# Patient Record
Sex: Male | Born: 1990 | Race: Black or African American | Hispanic: No | Marital: Single | Smoking: Current every day smoker
Health system: Southern US, Community
[De-identification: ages and names within clinical notes are randomized; demographics above are authoritative.]

## PROBLEM LIST (undated history)

## (undated) DIAGNOSIS — F909 Attention-deficit hyperactivity disorder, unspecified type: Secondary | ICD-10-CM

## (undated) DIAGNOSIS — J45909 Unspecified asthma, uncomplicated: Secondary | ICD-10-CM

## (undated) HISTORY — PX: NO PAST SURGERIES: SHX2092

---
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
## 2013-08-11 ENCOUNTER — Encounter (HOSPITAL_COMMUNITY): Payer: Self-pay | Admitting: *Deleted

## 2013-08-11 ENCOUNTER — Emergency Department (INDEPENDENT_AMBULATORY_CARE_PROVIDER_SITE_OTHER)
Admission: EM | Admit: 2013-08-11 | Discharge: 2013-08-11 | Disposition: A | Discharge status: Home / Self Care | Payer: Medicaid Other | Source: Home / Self Care | Attending: Emergency Medicine | Admitting: Emergency Medicine

## 2013-08-11 DIAGNOSIS — J020 Streptococcal pharyngitis: Secondary | ICD-10-CM

## 2013-08-11 MED ORDER — METHYLPREDNISOLONE 4 MG PO KIT: PACK | ORAL | Status: DC

## 2013-08-11 MED ORDER — PENICILLIN V POTASSIUM 500 MG PO TABS: 500.0000 mg | ORAL_TABLET | Freq: Three times a day (TID) | ORAL | Status: DC

## 2013-08-11 NOTE — ED Provider Notes (Signed)
CSN: [id number]     Arrival date & time 08/11/13  1050 History     First MD Initiated Contact with Patient 08/11/13 1210     Chief Complaint  Patient presents with  . Sore Throat   (Consider location/radiation/quality/duration/timing/severity/associated sxs/prior Treatment) HPI Comments: 22 year old male presents for evaluation of sore throat, getting progressively worse for the past week. The pain is exacerbated by swallowing or breathing into his toes. Apart from the sore throat, he denies any symptoms. He denies fever, chills, NVD, rash. No sick contacts or recent travel. He has saltwater and taken over-the-counter analgesics with mild to moderate relief in his symptoms.  Patient is a 22 y.o. male presenting with pharyngitis.  Sore Throat Pertinent negatives include no chest pain, no abdominal pain and no shortness of breath.    History reviewed. No pertinent past medical history. History reviewed. No pertinent past surgical history. No family history on file. History  Substance Use Topics  . Smoking status: Never Smoker   . Smokeless tobacco: Not on file  . Alcohol Use: No    Review of Systems  Constitutional: Negative for fever, chills and fatigue.  HENT: Positive for sore throat. Negative for neck pain and neck stiffness.   Eyes: Negative for visual disturbance.  Respiratory: Negative for cough and shortness of breath.   Cardiovascular: Negative for chest pain, palpitations and leg swelling.  Gastrointestinal: Negative for nausea, vomiting, abdominal pain, diarrhea and constipation.  Genitourinary: Negative for dysuria, urgency, frequency and hematuria.  Musculoskeletal: Negative for myalgias and arthralgias.  Skin: Negative for rash.  Neurological: Negative for dizziness, weakness and light-headedness.    Allergies  Review of patient's allergies indicates no known allergies.  Home Medications   Current Outpatient Rx  Name  Route  Sig  Dispense  Refill  .  methylPREDNISolone (MEDROL DOSEPAK) 4 MG tablet      Use as directed   21 tablet   0   . penicillin v potassium (VEETID) 500 MG tablet   Oral   Take 1 tablet (500 mg total) by mouth 3 (three) times daily.   21 tablet   0    BP 125/88  Pulse 60  Temp(Src) 98.5 F (36.9 C) (Oral)  Resp 20  SpO2 100% Physical Exam  Nursing note and vitals reviewed. Constitutional: He is oriented to person, place, and time. He appears well-developed and well-nourished. No distress.  HENT:  Head: Normocephalic and atraumatic.  Mouth/Throat: Oropharyngeal exudate (with erythema) present.  Eyes: Conjunctivae are normal. Pupils are equal, round, and reactive to light.  Pulmonary/Chest: Effort normal. No respiratory distress.  Neurological: He is alert and oriented to person, place, and time. Coordination normal.  Skin: Skin is warm and dry. No rash noted. He is not diaphoretic.  Psychiatric: He has a normal mood and affect. Judgment normal.    ED Course   Procedures (including critical care time)  Labs Reviewed  POCT RAPID STREP A ([facility] CARE ONLY) - Abnormal; Notable for the following:    Streptococcus, Group A Screen (Direct) POSITIVE (*)    All other components within normal limits   No results found. 1. Streptococcal sore throat     MDM  Treat with penicillin, Medrol Dosepak. Tylenol for ibuprofen as needed for pain. Followup if worsening.   Meds ordered this encounter  Medications  . penicillin v potassium (VEETID) 500 MG tablet    Sig: Take 1 tablet (500 mg total) by mouth 3 (three) times daily.    Dispense:  21 tablet    Refill:  0  . methylPREDNISolone (MEDROL DOSEPAK) 4 MG tablet    Sig: Use as directed    Dispense:  21 tablet    Refill:  0     Graylon Good, PA-C 08/11/13 1225

## 2013-08-11 NOTE — ED Provider Notes (Signed)
Medical screening examination/treatment/procedure(s) were performed by non-physician practitioner and as supervising physician I was immediately available for consultation/collaboration.  Anasophia Pecor, M.D.  Dickie Labarre C Tonetta Napoles, MD 08/11/13 1436 

## 2013-08-11 NOTE — ED Notes (Signed)
Patient complains of sore throat; burning sensation states hard to breath and swallow.

## 2019-10-25 ENCOUNTER — Emergency Department (HOSPITAL_COMMUNITY)
Admission: EM | Admit: 2019-10-25 | Discharge: 2019-10-26 | Disposition: A | Discharge status: Home / Self Care | Payer: Self-pay | Attending: Emergency Medicine | Admitting: Emergency Medicine

## 2019-10-25 ENCOUNTER — Other Ambulatory Visit: Payer: Self-pay

## 2019-10-25 ENCOUNTER — Encounter (HOSPITAL_COMMUNITY): Payer: Self-pay | Admitting: Emergency Medicine

## 2019-10-25 ENCOUNTER — Emergency Department (HOSPITAL_COMMUNITY): Payer: Self-pay

## 2019-10-25 DIAGNOSIS — W228XXA Striking against or struck by other objects, initial encounter: Secondary | ICD-10-CM | POA: Insufficient documentation

## 2019-10-25 DIAGNOSIS — F17210 Nicotine dependence, cigarettes, uncomplicated: Secondary | ICD-10-CM | POA: Insufficient documentation

## 2019-10-25 DIAGNOSIS — Y9389 Activity, other specified: Secondary | ICD-10-CM | POA: Insufficient documentation

## 2019-10-25 DIAGNOSIS — F1210 Cannabis abuse, uncomplicated: Secondary | ICD-10-CM | POA: Insufficient documentation

## 2019-10-25 DIAGNOSIS — Y929 Unspecified place or not applicable: Secondary | ICD-10-CM | POA: Insufficient documentation

## 2019-10-25 DIAGNOSIS — Y998 Other external cause status: Secondary | ICD-10-CM | POA: Insufficient documentation

## 2019-10-25 DIAGNOSIS — S62350A Nondisplaced fracture of shaft of second metacarpal bone, right hand, initial encounter for closed fracture: Secondary | ICD-10-CM | POA: Insufficient documentation
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
## 2019-10-26 MED ORDER — TRAMADOL HCL 50 MG PO TABS: 50.0000 mg | ORAL_TABLET | Freq: Four times a day (QID) | ORAL | 0 refills | Status: DC | PRN

## 2019-10-26 NOTE — Discharge Instructions (Signed)
Take the prescribed medication as directed. Follow-up with Dr. Fredna Dow-- call his office on Monday to schedule appt. Return to the ED for new or worsening symptoms.

## 2019-10-26 NOTE — ED Notes (Signed)
Ortho tech paged for splint.

## 2019-10-26 NOTE — ED Provider Notes (Signed)
[facility] EMERGENCY DEPARTMENT Provider Note   CSN: [id number] Arrival date & time: 10/25/19  2338     History   Chief Complaint Chief Complaint  Patient presents with  . Hand Injury    Right    HPI Chris Fitzgerald is a 28 y.o. male.     The history is provided by the patient and medical records.  Hand Injury    28 year old male presenting to the ED with right hand injury.  States he punched a door in anger yesterday.  He has had ongoing swelling and pain, mostly along his first metacarpal of the hand but today has started to spread.  He denies any numbness or weakness of the right hand.  Has normal sensation throughout.  He is right-hand dominant.  He has tried applying ice at home without relief.  History reviewed. No pertinent past medical history.  There are no active problems to display for this patient.   History reviewed. No pertinent surgical history.      Home Medications    Prior to Admission medications   Medication Sig Start Date End Date Taking? Authorizing Provider  methylPREDNISolone (MEDROL DOSEPAK) 4 MG tablet Use as directed 08/11/13   Allena Katz H, PA-C  penicillin v potassium (VEETID) 500 MG tablet Take 1 tablet (500 mg total) by mouth 3 (three) times daily. 08/11/13   Liam Graham, PA-C    Family History No family history on file.  Social History Social History   Tobacco Use  . Smoking status: Current Every Day Smoker    Types: Cigarettes  Substance Use Topics  . Alcohol use: No  . Drug use: Yes    Types: Marijuana     Allergies   Patient has no known allergies.   Review of Systems Review of Systems  Musculoskeletal: Positive for arthralgias.  All other systems reviewed and are negative.    Physical Exam Updated Vital Signs BP 130/74 (BP Location: Left Arm)   Pulse 71   Temp 98.2 F (36.8 C) (Oral)   Resp 18   SpO2 98%   Physical Exam Vitals signs and nursing note reviewed.   Constitutional:      Appearance: He is well-developed.  HENT:     Head: Normocephalic and atraumatic.  Eyes:     Conjunctiva/sclera: Conjunctivae normal.     Pupils: Pupils are equal, round, and reactive to light.  Neck:     Musculoskeletal: Normal range of motion.  Cardiovascular:     Rate and Rhythm: Normal rate and regular rhythm.     Heart sounds: Normal heart sounds.  Pulmonary:     Effort: Pulmonary effort is normal.     Breath sounds: Normal breath sounds.  Abdominal:     General: Bowel sounds are normal.     Palpations: Abdomen is soft.  Musculoskeletal: Normal range of motion.     Comments: Right hand with diffuse swelling along the dorsal aspect, there is no open wound or laceration, tenderness along the first metacarpal without gross deformity, pain with range of motion of the index finger, otherwise normal range of motion, normal distal sensation and cap refill throughout  Skin:    General: Skin is warm and dry.  Neurological:     Mental Status: He is alert and oriented to person, place, and time.      ED Treatments / Results  Labs (all labs ordered are listed, but only abnormal results are displayed) Labs Reviewed - No data to  display  EKG None  Radiology Dg Hand Complete Right  Result Date: 10/26/2019 CLINICAL DATA:  Injury, swelling EXAM: RIGHT HAND - COMPLETE 3+ VIEW COMPARISON:  None. FINDINGS: There is a nondisplaced obliquely oriented fracture seen through the base of the second metacarpal probable intra-articular extension. No other definite fracture seen. Dorsal soft tissue swelling seen. IMPRESSION: Nondisplaced obliquely oriented fracture seen through the base of the second metacarpal. Electronically Signed   By: Jonna Clark M.D.   On: 10/26/2019 00:02    Procedures Procedures (including critical care time)  Medications Ordered in ED Medications - No data to display   Initial Impression / Assessment and Plan / ED Course  I have reviewed the  triage vital signs and the nursing notes.  Pertinent labs & imaging results that were available during my care of the patient were reviewed by me and considered in my medical decision making (see chart for details).  28 year old male presenting to the ED with right hand injury after punching a door yesterday.  Has diffuse swelling of dorsal right hand but no significant deformities on exam.  His hand is neurovascularly intact.  X-ray with nondisplaced, oblique fracture through the second metacarpal.  Volar splint applied, will be given hand surgery follow-up.  Rx tramadol.  May return here for any new/acute changes.  Final Clinical Impressions(s) / ED Diagnoses   Final diagnoses:  Closed nondisplaced fracture of shaft of first metacarpal bone of right hand, initial encounter    ED Discharge Orders         Ordered    traMADol (ULTRAM) 50 MG tablet  Every 6 hours PRN     10/26/19 0226           Garlon Hatchet, PA-C 10/26/19 0304    Zadie Rhine, MD 10/26/19 0725

## 2019-10-26 NOTE — ED Notes (Signed)
Discharge instructions reviewed with pt. Pt verbalized understanding.   

## 2019-10-26 NOTE — Progress Notes (Signed)
Orthopedic Tech Progress Note Patient Details:  Chris Fitzgerald 02-21-1991 [id number]  Ortho Devices Type of Ortho Device: Arm sling, Volar splint Ortho Device/Splint Location: rue Ortho Device/Splint Interventions: Ordered, Application, Adjustment   Post Interventions Patient Tolerated: Well Instructions Provided: Care of device, Adjustment of device   [name] 10/26/2019, 2:34 AM

## 2022-10-27 ENCOUNTER — Encounter (HOSPITAL_BASED_OUTPATIENT_CLINIC_OR_DEPARTMENT_OTHER): Payer: Self-pay

## 2022-10-27 ENCOUNTER — Emergency Department (HOSPITAL_BASED_OUTPATIENT_CLINIC_OR_DEPARTMENT_OTHER): Payer: Medicaid Other | Admitting: Radiology

## 2022-10-27 ENCOUNTER — Emergency Department (HOSPITAL_BASED_OUTPATIENT_CLINIC_OR_DEPARTMENT_OTHER)
Admission: EM | Admit: 2022-10-27 | Discharge: 2022-10-27 | Disposition: A | Discharge status: Home / Self Care | Payer: Self-pay | Attending: Emergency Medicine | Admitting: Emergency Medicine

## 2022-10-27 ENCOUNTER — Other Ambulatory Visit: Payer: Self-pay

## 2022-10-27 DIAGNOSIS — Z23 Encounter for immunization: Secondary | ICD-10-CM | POA: Insufficient documentation

## 2022-10-27 DIAGNOSIS — S61216A Laceration without foreign body of right little finger without damage to nail, initial encounter: Secondary | ICD-10-CM | POA: Insufficient documentation

## 2022-10-27 DIAGNOSIS — Y990 Civilian activity done for income or pay: Secondary | ICD-10-CM | POA: Insufficient documentation

## 2022-10-27 DIAGNOSIS — W269XXA Contact with unspecified sharp object(s), initial encounter: Secondary | ICD-10-CM | POA: Insufficient documentation

## 2022-10-27 MED ORDER — TETANUS-DIPHTH-ACELL PERTUSSIS 5-2.5-18.5 LF-MCG/0.5 IM SUSY
0.5000 mL | PREFILLED_SYRINGE | Freq: Once | INTRAMUSCULAR | Status: AC
  Administered 2022-10-27: 0.5 mL via INTRAMUSCULAR
  Filled 2022-10-27: qty 0.5

## 2022-10-27 NOTE — ED Triage Notes (Signed)
Pt states was working this morning and finger was caught in machinery causing a laceration of finger. Pt has feeling in finger and denies and numbness.

## 2022-10-27 NOTE — Discharge Instructions (Addendum)
Return if any problems.

## 2022-10-27 NOTE — ED Notes (Signed)
Discharge instructions discussed with pt. Pt verbalized understanding. Pt stable and ambulatory.  °

## 2022-10-27 NOTE — ED Provider Notes (Signed)
[facility] EMERGENCY DEPT Provider Note   CSN: [id number] Arrival date & time: 10/27/22  4259     History  Chief Complaint  Patient presents with   Laceration    Right pinky finger     Chris Fitzgerald is a 31 y.o. male.  Pt cut his finger at work.  Pt complains of cut only.   The history is provided by the patient. No language interpreter was used.  Laceration Location:  Finger Finger laceration location:  R little finger Length:  0.4 Depth:  Cutaneous Quality: stellate   Time since incident:  2 weeks Laceration mechanism:  Metal edge Pain details:    Quality:  Aching   Severity:  No pain   Timing:  Constant Relieved by:  Nothing Worsened by:  Nothing Tetanus status:  Unknown      Home Medications Prior to Admission medications   Medication Sig Start Date End Date Taking? Authorizing Provider  methylPREDNISolone (MEDROL DOSEPAK) 4 MG tablet Use as directed 08/11/13   Autumn Messing H, PA-C  penicillin v potassium (VEETID) 500 MG tablet Take 1 tablet (500 mg total) by mouth 3 (three) times daily. Patient not taking: Reported on 10/27/2022 08/11/13   Graylon Good, PA-C  traMADol (ULTRAM) 50 MG tablet Take 1 tablet (50 mg total) by mouth every 6 (six) hours as needed. Patient not taking: Reported on 10/27/2022 10/26/19   Garlon Hatchet, PA-C      Allergies    Patient has no known allergies.    Review of Systems   Review of Systems  All other systems reviewed and are negative.   Physical Exam Updated Vital Signs BP 127/86 (BP Location: Left Arm)   Pulse 81   Temp 98.1 F (36.7 C)   Resp 18   Ht 6' (1.829 m)   Wt 76.2 kg   SpO2 98%   BMI 22.78 kg/m  Physical Exam Vitals reviewed.  Constitutional:      Appearance: Normal appearance.  Cardiovascular:     Rate and Rhythm: Normal rate.  Pulmonary:     Effort: Pulmonary effort is normal.  Musculoskeletal:     Comments: 48mm laceration from nv and ns intact   Skin:    General: Skin  is warm.  Neurological:     General: No focal deficit present.     Mental Status: He is alert.  Psychiatric:        Mood and Affect: Mood normal.     ED Results / Procedures / Treatments   Labs (all labs ordered are listed, but only abnormal results are displayed) Labs Reviewed - No data to display  EKG None  Radiology DG Finger Little Right  Result Date: 10/27/2022 CLINICAL DATA:  31 year old male with 5th finger caught in machine this morning. EXAM: RIGHT LITTLE FINGER 2+V COMPARISON:  Right hand series 10/25/2019. FINDINGS: Bone mineralization is within normal limits. There is no evidence of fracture or dislocation. There is no evidence of arthropathy or other focal bone abnormality. Small linear area of soft tissue injury/irregularity along the ulnar aspect of 5th middle phalanx. No definite radiopaque foreign body identified. No soft tissue gas. IMPRESSION: soft tissue irregularity along the ulnar side of the 5th middle phalanx. No osseous abnormality identified. Electronically Signed   By: Odessa Fleming M.D.   On: 10/27/2022 11:01    Procedures .Marland KitchenLaceration Repair  Date/Time: 10/27/2022 1:19 PM  Performed by: Elson Areas, PA-C Authorized by: Elson Areas, PA-C   Consent:  Consent obtained:  Verbal   Consent given by:  Patient   Risks discussed:  Infection   Alternatives discussed:  No treatment Universal protocol:    Procedure explained and questions answered to patient or proxy's satisfaction: yes     Immediately prior to procedure, a time out was called: yes     Patient identity confirmed:  Verbally with patient Laceration details:    Location:  Finger   Finger location:  R small finger   Length (cm):  0.4 Pre-procedure details:    Preparation:  Patient was prepped and draped in usual sterile fashion Treatment:    Area cleansed with:  Povidone-iodine   Amount of cleaning:  Standard   Debridement:  None   Undermining:  None Skin repair:    Repair method:   Tissue adhesive Approximation:    Approximation:  Loose Repair type:    Repair type:  Simple     Medications Ordered in ED Medications  Tdap (BOOSTRIX) injection 0.5 mL (0.5 mLs Intramuscular Given 10/27/22 1218)    ED Course/ Medical Decision Making/ A&P                           Medical Decision Making Pt complains of a finger laceration.  Amount and/or Complexity of Data Reviewed Radiology: ordered and independent interpretation performed. Decision-making details documented in ED Course.    Details: Xray  no fracture   Risk Prescription drug management. Risk Details: See procedure note           Final Clinical Impression(s) / ED Diagnoses Final diagnoses:  Laceration of right little finger, foreign body presence unspecified, nail damage status unspecified, initial encounter    Rx / DC Orders ED Discharge Orders     None     An After Visit Summary was printed and given to the patient.     Elson Areas, [place] 10/27/22 1320    Mardene Sayer, MD 10/27/22 [phone]

## 2023-04-19 ENCOUNTER — Other Ambulatory Visit: Payer: Self-pay

## 2023-04-19 ENCOUNTER — Emergency Department (HOSPITAL_BASED_OUTPATIENT_CLINIC_OR_DEPARTMENT_OTHER)
Admission: EM | Admit: 2023-04-19 | Discharge: 2023-04-19 | Disposition: A | Discharge status: Home / Self Care | Payer: Self-pay | Attending: Emergency Medicine | Admitting: Emergency Medicine

## 2023-04-19 ENCOUNTER — Emergency Department (HOSPITAL_BASED_OUTPATIENT_CLINIC_OR_DEPARTMENT_OTHER): Payer: Self-pay | Admitting: Radiology

## 2023-04-19 ENCOUNTER — Encounter (HOSPITAL_BASED_OUTPATIENT_CLINIC_OR_DEPARTMENT_OTHER): Payer: Self-pay

## 2023-04-19 DIAGNOSIS — Y990 Civilian activity done for income or pay: Secondary | ICD-10-CM | POA: Insufficient documentation

## 2023-04-19 DIAGNOSIS — W228XXA Striking against or struck by other objects, initial encounter: Secondary | ICD-10-CM | POA: Insufficient documentation

## 2023-04-19 DIAGNOSIS — Y92810 Car as the place of occurrence of the external cause: Secondary | ICD-10-CM | POA: Insufficient documentation

## 2023-04-19 DIAGNOSIS — S62337A Displaced fracture of neck of fifth metacarpal bone, left hand, initial encounter for closed fracture: Secondary | ICD-10-CM | POA: Insufficient documentation

## 2023-04-19 MED ORDER — OXYCODONE-ACETAMINOPHEN 5-325 MG PO TABS: 1.0000 | ORAL_TABLET | Freq: Four times a day (QID) | ORAL | 0 refills | Status: AC | PRN

## 2023-04-19 MED ORDER — SENNOSIDES-DOCUSATE SODIUM 8.6-50 MG PO TABS: 1.0000 | ORAL_TABLET | Freq: Every evening | ORAL | 0 refills | Status: AC | PRN

## 2023-04-19 MED ORDER — IBUPROFEN 800 MG PO TABS: 800.0000 mg | ORAL_TABLET | Freq: Three times a day (TID) | ORAL | 0 refills | Status: DC | PRN

## 2023-04-19 MED ORDER — IBUPROFEN 800 MG PO TABS
800.0000 mg | ORAL_TABLET | Freq: Once | ORAL | Status: AC | PRN
  Administered 2023-04-19: 800 mg via ORAL
  Filled 2023-04-19: qty 1

## 2023-04-19 NOTE — Discharge Instructions (Signed)
Call the orthopedic doctor listed today for an appointment in the coming week as this may require surgery.
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
## 2023-04-19 NOTE — ED Notes (Signed)
RN reviewed discharge instructions with pt. Pt verbalized understanding and had no further questions. VSS upon discharge.  

## 2023-04-26 ENCOUNTER — Other Ambulatory Visit: Payer: Self-pay

## 2023-04-26 ENCOUNTER — Encounter (HOSPITAL_BASED_OUTPATIENT_CLINIC_OR_DEPARTMENT_OTHER): Payer: Self-pay | Admitting: Orthopedic Surgery

## 2023-04-30 NOTE — H&P (Signed)
Preoperative History & Physical Exam  Surgeon: Philipp Ovens, MD  Diagnosis: Closed fracture of fifth metacarpal bone of left hand, closed fracture of fourth metacarpal bone of left hand   Planned Procedure: Procedure(s) (LRB): OPEN REDUCTION INTERNAL FIXATION (ORIF) METACARPAL left 5th (Left) CLOSED REDUCTION METACARPAL (FINGER) 4th (Left)  History of Present Illness:   Patient is a 32 y.o. adult with symptoms consistent with Closed fracture of fifth metacarpal bone of left hand who presents for surgical intervention. The risks, benefits and alternatives of surgical intervention were discussed and informed consent was obtained prior to surgery.  Past Medical History:  Past Medical History:  Diagnosis Date   ADHD (attention deficit hyperactivity disorder)    Asthma     Past Surgical History:  Past Surgical History:  Procedure Laterality Date   NO PAST SURGERIES      Medications:  Prior to Admission medications   Medication Sig Start Date End Date Taking? Authorizing Provider  ibuprofen (ADVIL) 800 MG tablet Take 1 tablet (800 mg total) by mouth every 8 (eight) hours as needed for moderate pain. 04/19/23  Yes Long, Arlyss Repress, MD  senna-docusate (SENOKOT-S) 8.6-50 MG tablet Take 1 tablet by mouth at bedtime as needed for mild constipation. 04/19/23  Yes Long, Arlyss Repress, MD  oxyCODONE-acetaminophen (PERCOCET/ROXICET) 5-325 MG tablet Take 1 tablet by mouth every 6 (six) hours as needed for severe pain. Patient not taking: Reported on 04/26/2023 04/19/23   Long, Arlyss Repress, MD    Allergies:  Patient has no known allergies.  Review of Systems: Negative except per HPI.  Physical Exam: Alert and oriented, NAD Head and neck: no masses, normal alignment CV: pulse intact Pulm: no increased work of breathing, respirations even and unlabored Abdomen: non-distended Extremities: extremities warm and well perfused  LABS: No results found for this or any previous visit (from the past 2160  hour(s)).   Complete History and Physical exam available in the office notes  Nolberto Hanlon Kairi Tufo

## 2023-05-02 ENCOUNTER — Other Ambulatory Visit: Payer: Self-pay

## 2023-05-02 ENCOUNTER — Encounter (HOSPITAL_BASED_OUTPATIENT_CLINIC_OR_DEPARTMENT_OTHER)
Admission: RE | Disposition: A | Discharge status: Home / Self Care | Payer: Self-pay | Source: Home / Self Care | Attending: Orthopedic Surgery

## 2023-05-02 ENCOUNTER — Other Ambulatory Visit: Payer: Self-pay | Admitting: Physician Assistant

## 2023-05-02 ENCOUNTER — Other Ambulatory Visit (HOSPITAL_BASED_OUTPATIENT_CLINIC_OR_DEPARTMENT_OTHER): Payer: Self-pay

## 2023-05-02 ENCOUNTER — Encounter (HOSPITAL_BASED_OUTPATIENT_CLINIC_OR_DEPARTMENT_OTHER): Payer: Self-pay | Admitting: Orthopedic Surgery

## 2023-05-02 ENCOUNTER — Ambulatory Visit (HOSPITAL_BASED_OUTPATIENT_CLINIC_OR_DEPARTMENT_OTHER): Payer: Self-pay | Admitting: Anesthesiology

## 2023-05-02 ENCOUNTER — Ambulatory Visit (HOSPITAL_BASED_OUTPATIENT_CLINIC_OR_DEPARTMENT_OTHER)
Admission: RE | Admit: 2023-05-02 | Discharge: 2023-05-02 | Disposition: A | Discharge status: Home / Self Care | Payer: Self-pay | Attending: Orthopedic Surgery | Admitting: Orthopedic Surgery

## 2023-05-02 ENCOUNTER — Ambulatory Visit (HOSPITAL_BASED_OUTPATIENT_CLINIC_OR_DEPARTMENT_OTHER): Payer: Self-pay

## 2023-05-02 DIAGNOSIS — X58XXXA Exposure to other specified factors, initial encounter: Secondary | ICD-10-CM | POA: Insufficient documentation

## 2023-05-02 DIAGNOSIS — J45909 Unspecified asthma, uncomplicated: Secondary | ICD-10-CM

## 2023-05-02 DIAGNOSIS — F17210 Nicotine dependence, cigarettes, uncomplicated: Secondary | ICD-10-CM

## 2023-05-02 DIAGNOSIS — S62307A Unspecified fracture of fifth metacarpal bone, left hand, initial encounter for closed fracture: Secondary | ICD-10-CM

## 2023-05-02 DIAGNOSIS — F909 Attention-deficit hyperactivity disorder, unspecified type: Secondary | ICD-10-CM

## 2023-05-02 DIAGNOSIS — S62315A Displaced fracture of base of fourth metacarpal bone, left hand, initial encounter for closed fracture: Secondary | ICD-10-CM | POA: Insufficient documentation

## 2023-05-02 DIAGNOSIS — S62337A Displaced fracture of neck of fifth metacarpal bone, left hand, initial encounter for closed fracture: Secondary | ICD-10-CM | POA: Insufficient documentation

## 2023-05-02 HISTORY — DX: Attention-deficit hyperactivity disorder, unspecified type: F90.9

## 2023-05-02 HISTORY — PX: FINGER CLOSED REDUCTION: SHX1633

## 2023-05-02 HISTORY — DX: Unspecified asthma, uncomplicated: J45.909

## 2023-05-02 HISTORY — PX: OPEN REDUCTION INTERNAL FIXATION (ORIF) METACARPAL: SHX6234

## 2023-05-02 SURGERY — OPEN REDUCTION INTERNAL FIXATION (ORIF) METACARPAL
Anesthesia: Monitor Anesthesia Care | Site: Finger | Laterality: Left

## 2023-05-02 MED ORDER — DEXAMETHASONE SODIUM PHOSPHATE 10 MG/ML IJ SOLN
INTRAMUSCULAR | Status: DC | PRN
  Administered 2023-05-02: 10 mg

## 2023-05-02 MED ORDER — FENTANYL CITRATE (PF) 100 MCG/2ML IJ SOLN
100.0000 ug | Freq: Once | INTRAMUSCULAR | Status: AC
  Administered 2023-05-02: 100 ug via INTRAVENOUS

## 2023-05-02 MED ORDER — CEFAZOLIN SODIUM-DEXTROSE 2-4 GM/100ML-% IV SOLN
INTRAVENOUS | Status: AC
  Filled 2023-05-02: qty 100

## 2023-05-02 MED ORDER — IBUPROFEN 800 MG PO TABS
800.0000 mg | ORAL_TABLET | Freq: Three times a day (TID) | ORAL | 0 refills | Status: AC | PRN
  Filled 2023-05-02: qty 21, 7d supply, fill #0

## 2023-05-02 MED ORDER — ACETAMINOPHEN 500 MG PO TABS
1000.0000 mg | ORAL_TABLET | Freq: Once | ORAL | Status: AC
  Administered 2023-05-02: 1000 mg via ORAL

## 2023-05-02 MED ORDER — 0.9 % SODIUM CHLORIDE (POUR BTL) OPTIME
TOPICAL | Status: DC | PRN
  Administered 2023-05-02: 100 mL

## 2023-05-02 MED ORDER — DEXMEDETOMIDINE HCL IN NACL 80 MCG/20ML IV SOLN
INTRAVENOUS | Status: DC | PRN
  Administered 2023-05-02: 8 ug via INTRAVENOUS

## 2023-05-02 MED ORDER — PROPOFOL 500 MG/50ML IV EMUL
INTRAVENOUS | Status: DC | PRN
  Administered 2023-05-02: 150 ug/kg/min via INTRAVENOUS

## 2023-05-02 MED ORDER — FENTANYL CITRATE (PF) 100 MCG/2ML IJ SOLN
INTRAMUSCULAR | Status: AC
  Filled 2023-05-02: qty 2

## 2023-05-02 MED ORDER — BACITRACIN ZINC 500 UNIT/GM EX OINT
TOPICAL_OINTMENT | CUTANEOUS | Status: DC | PRN
  Administered 2023-05-02: 1 via TOPICAL

## 2023-05-02 MED ORDER — MIDAZOLAM HCL 2 MG/2ML IJ SOLN
INTRAMUSCULAR | Status: AC
  Filled 2023-05-02: qty 2

## 2023-05-02 MED ORDER — CEFAZOLIN SODIUM-DEXTROSE 2-4 GM/100ML-% IV SOLN
2.0000 g | INTRAVENOUS | Status: AC
  Administered 2023-05-02: 2 g via INTRAVENOUS

## 2023-05-02 MED ORDER — ONDANSETRON HCL 4 MG/2ML IJ SOLN
INTRAMUSCULAR | Status: DC | PRN
  Administered 2023-05-02: 4 mg via INTRAVENOUS

## 2023-05-02 MED ORDER — LACTATED RINGERS IV SOLN: INTRAVENOUS | Status: DC

## 2023-05-02 MED ORDER — MIDAZOLAM HCL 2 MG/2ML IJ SOLN
2.0000 mg | Freq: Once | INTRAMUSCULAR | Status: AC
  Administered 2023-05-02: 2 mg via INTRAVENOUS

## 2023-05-02 MED ORDER — ACETAMINOPHEN 500 MG PO TABS
ORAL_TABLET | ORAL | Status: AC
  Filled 2023-05-02: qty 2

## 2023-05-02 MED ORDER — FENTANYL CITRATE (PF) 100 MCG/2ML IJ SOLN: 25.0000 ug | INTRAMUSCULAR | Status: DC | PRN

## 2023-05-02 MED ORDER — IBUPROFEN 800 MG PO TABS: 800.0000 mg | ORAL_TABLET | Freq: Three times a day (TID) | ORAL | 0 refills | Status: DC | PRN

## 2023-05-02 MED ORDER — PROPOFOL 10 MG/ML IV BOLUS
INTRAVENOUS | Status: DC | PRN
  Administered 2023-05-02: 40 mg via INTRAVENOUS

## 2023-05-02 MED ORDER — ROPIVACAINE HCL 5 MG/ML IJ SOLN
INTRAMUSCULAR | Status: DC | PRN
  Administered 2023-05-02: 25 mL via PERINEURAL

## 2023-05-02 SURGICAL SUPPLY — 39 items
BLADE SURG 15 STRL LF DISP TIS (BLADE) ×1 IMPLANT
BLADE SURG 15 STRL SS (BLADE) ×1
BNDG CMPR 5X4 KNIT ELC UNQ LF (GAUZE/BANDAGES/DRESSINGS) ×1
BNDG CMPR 9X4 STRL LF SNTH (GAUZE/BANDAGES/DRESSINGS) ×1
BNDG ELASTIC 4INX 5YD STR LF (GAUZE/BANDAGES/DRESSINGS) ×1 IMPLANT
BNDG ESMARK 4X9 LF (GAUZE/BANDAGES/DRESSINGS) ×1 IMPLANT
COVER BACK TABLE 60X90IN (DRAPES) ×1 IMPLANT
CUFF TOURN SGL QUICK 18X4 (TOURNIQUET CUFF) ×1 IMPLANT
DRAPE EXTREMITY T 121X128X90 (DISPOSABLE) ×1 IMPLANT
DRAPE OEC MINIVIEW 54X84 (DRAPES) ×1 IMPLANT
DRAPE SURG 17X23 STRL (DRAPES) ×1 IMPLANT
DRSG EMULSION OIL 3X3 NADH (GAUZE/BANDAGES/DRESSINGS) ×1 IMPLANT
GAUZE SPONGE 4X4 12PLY STRL (GAUZE/BANDAGES/DRESSINGS) ×1 IMPLANT
GLOVE BIO SURGEON STRL SZ 6 (GLOVE) ×1 IMPLANT
GLOVE BIO SURGEON STRL SZ7.5 (GLOVE) ×1 IMPLANT
GLOVE BIOGEL PI IND STRL 6.5 (GLOVE) ×1 IMPLANT
GLOVE BIOGEL PI IND STRL 7.5 (GLOVE) ×1 IMPLANT
GOWN STRL REUS W/ TWL LRG LVL3 (GOWN DISPOSABLE) ×1 IMPLANT
GOWN STRL REUS W/TWL LRG LVL3 (GOWN DISPOSABLE) ×1
GOWN STRL REUS W/TWL XL LVL3 (GOWN DISPOSABLE) ×1 IMPLANT
KIT INNATE INSTRUMENT FOR 4.5 (INSTRUMENTS) IMPLANT
NAIL IM THRD INNATE 4.5X40 (Nail) IMPLANT
NDL HYPO 22X1.5 SAFETY MO (MISCELLANEOUS) IMPLANT
NEEDLE HYPO 22X1.5 SAFETY MO (MISCELLANEOUS) IMPLANT
NS IRRIG 1000ML POUR BTL (IV SOLUTION) ×1 IMPLANT
PACK BASIN DAY SURGERY FS (CUSTOM PROCEDURE TRAY) ×1 IMPLANT
PADDING CAST ABS COTTON 4X4 ST (CAST SUPPLIES) ×1 IMPLANT
PADDING CAST SYNTHETIC 4X4 STR (CAST SUPPLIES) ×1 IMPLANT
SHEET MEDIUM DRAPE 40X70 STRL (DRAPES) ×1 IMPLANT
SLING ARM FOAM STRAP LRG (SOFTGOODS) IMPLANT
SPLINT FIBERGLASS 3X35 (CAST SUPPLIES) IMPLANT
SPLINT FIBERGLASS 4X30 (CAST SUPPLIES) IMPLANT
SUT ETHILON 4 0 PS 2 18 (SUTURE) ×1 IMPLANT
SYR 10ML LL (SYRINGE) IMPLANT
SYR BULB EAR ULCER 3OZ GRN STR (SYRINGE) ×1 IMPLANT
TAPE SURG TRANSPORE 1 IN (GAUZE/BANDAGES/DRESSINGS) ×1 IMPLANT
TOWEL GREEN STERILE FF (TOWEL DISPOSABLE) ×2 IMPLANT
TRAY DSU PREP LF (CUSTOM PROCEDURE TRAY) ×1 IMPLANT
UNDERPAD 30X36 HEAVY ABSORB (UNDERPADS AND DIAPERS) ×1 IMPLANT

## 2023-05-02 NOTE — Op Note (Signed)
OPERATIVE NOTE  DATE OF PROCEDURE: 05/02/2023  SURGEONS:  Primary: [name], MD  ASSISTANT: Payton Mccallum, PA-C  Due to the complexity of the surgery an assistant was necessary to aid in retraction, exposure, limb positioning, closure and dressing application. The use of an assistant on this case follows CMS and CPT guidelines, which allows an assistant to be used because of the complexity level of this case.   PREOPERATIVE DIAGNOSIS: Closed fracture of fourth and fifth metacarpal bones of left hand  POSTOPERATIVE DIAGNOSIS: Same  NAME OF PROCEDURE:   Open reduction internal fixation of the left fifth metacarpal neck fracture Closed reduction of the left fourth metacarpal base fracture 4 view radiographs of the left hand with intraoperative interpretation  ANESTHESIA: Monitor Anesthesia Care plus block  SKIN PREPARATION: Hibiclens  ESTIMATED BLOOD LOSS: Minimal  IMPLANTS:  Implant Name Type Inv. Item Serial No. Manufacturer Lot No. LRB No. Used Action  INnate 4.5 x 40 implant     [id number] Left 1 Implanted    INDICATIONS:  Chris Fitzgerald is a 32 y.o. male who has the above preoperative diagnosis. The patient has decided to proceed with surgical intervention.  Risks, benefits and alternatives of operative management were discussed including, but not limited to, risks of anesthesia complications, infection, pain, persistent symptoms, stiffness, need for future surgery.  The patient understands, agrees and elects to proceed with surgery.    DESCRIPTION OF PROCEDURE: The patient was met in the pre-operative area and their identity was verified.  The operative location and laterality was also verified and marked.  The patient was brought to the OR and was placed supine on the table.  After repeat patient identification with the operative team anesthesia was provided and the patient was prepped and draped in the usual sterile fashion.  A final timeout was performed verifying the correction  patient, procedure, location and laterality.  Preoperative antibiotics were provided and then the left upper extremity was elevated exsanguinated with an Esmarch and tourniquet inflated to 250 mmHg.  C-arm fluoroscopy was utilized to localize the fifth metacarpal head.  A K wire was placed in retrograde fashion in the center position on the AP radiograph and in the dorsal third on the lateral radiograph of the distal fragment of the left fifth metacarpal neck fracture.  This was then advanced.  An incision was made over this area and reduction of the fifth metacarpal neck was performed and then the guidewire was advanced to the base of the fifth metacarpal.  This was then measured and drilled and the appropriately sized metacarpal nail was then inserted.  There was excellent length alignment rotation and C-arm fluoroscopy confirmed adequate hardware placement and reduction of the fracture.  Closed reduction of the left fourth metacarpal base fracture was performed via closed treatment and application of a short arm splint MP flexion blocking.  The wound was thoroughly irrigated and closed with a chromic suture.  A sterile soft bandage and flexion blocking splint was performed.  The tourniquet was deflated and the fingers were pink and warm and well-perfused with brisk cap refill to the tips of all digits.  Care was taken also to assess for clinical rotation and there was excellent rotation with tenodesis of the fourth and fifth metacarpals.  The patient was awoken from anesthesia and brought to PACU for recovery in stable condition.   Philipp Ovens, MD

## 2023-05-02 NOTE — Discharge Instructions (Addendum)
Orthopaedic Hand Surgery Discharge Instructions  WEIGHT BEARING STATUS: Non weight bearing on operative extremity  DRESSING CARE: Please keep your dressing/splint/cast clean and dry until your follow-up appointment. You may shower by placing a waterproof covering over your dressing/splint/cast. Contact your surgeon if your splint/cast gets wet. It will need to be changed to prevent skin breakdown.  PAIN CONTROL: First line medications for post operative pain control are Tylenol (acetaminophen) and Motrin (ibuprofen) if you are able to take these medications. If you have been prescribed a medication these can be taken as breakthrough pain medications. Please note that some narcotic pain medication has acetaminophen added and you should never consume more than 4,000mg  of acetaminophen in 24-hour period. Please note that if you are given Toradol (ketorolac) you should not take similar medications such as ibuprofen or naproxen.  DISCHARGE MEDICATIONS: If you have been prescribed medication it was sent electronically to your pharmacy. No changes have been made to your home medications.  ICE/ELEVATION: Ice and elevate your injured extremity as needed. Avoid direct contact of ice with skin.   BANDAGE FEELS TOO TIGHT: If your bandage feels too tight, first make sure you are elevating your fingers as much as possible. The outer layer of the bandage can be unwrapped and reapplied more loosely. If no improvement, you may carefully cut the inner layer longitudinally until the pressure has resolved and then rewrap the outer layer. If you are not comfortable with these instructions, please call the office and the bandage can be changed for you.   FOLLOW UP: You will be called after surgery with an appointment date and time, however if you have not received a phone call within 3 days, please call during regular office hours at [phone] to schedule a post operative appointment.  Please Seek Medical Attention  if: Call MD for: pain or pressure in chest, jaw, arm, back, neck  Call MD for: temperature greater than 101 F for more than 24 hrs Call MD for: difficulty breathing Call MD for: incision redness, bleeding, drainage  Call MD for: palpitations or feeling that the heart is racing  Call MD for: increased swelling in arm, leg, ankle, or abdomen  Call MD for: lightheadedness, dizziness, fainting Call 911 or go to ER for any medical emergency if you are not able to get in touch with your doctor   J. Standley Dakins, MD Orthopaedic Hand Surgeon [facility] Office number: [phone] [address], [address] [place], [place] [address]    Post Anesthesia Home Care Instructions  Activity: Get plenty of rest for the remainder of the day. A responsible individual must stay with you for 24 hours following the procedure.  For the next 24 hours, DO NOT: -Drive a car -Advertising copywriter -Drink alcoholic beverages -Take any medication unless instructed by your physician -Make any legal decisions or sign important papers.  Meals: Start with liquid foods such as gelatin or soup. Progress to regular foods as tolerated. Avoid greasy, spicy, heavy foods. If nausea and/or vomiting occur, drink only clear liquids until the nausea and/or vomiting subsides. Call your physician if vomiting continues.  Special Instructions/Symptoms: Your throat may feel dry or sore from the anesthesia or the breathing tube placed in your throat during surgery. If this causes discomfort, gargle with warm salt water. The discomfort should disappear within 24 hours.  If you had a scopolamine patch placed behind your ear for the management of post- operative nausea and/or vomiting:  1. The medication in the patch is effective for  72 hours, after which it should be removed.  Wrap patch in a tissue and discard in the trash. Wash hands thoroughly with soap and water. 2. You may remove the patch earlier than 72 hours if you experience  unpleasant side effects which may include dry mouth, dizziness or visual disturbances. 3. Avoid touching the patch. Wash your hands with soap and water after contact with the patch.    Regional Anesthesia Blocks  1. Numbness or the inability to move the "blocked" extremity may last from 3-48 hours after placement. The length of time depends on the medication injected and your individual response to the medication. If the numbness is not going away after 48 hours, call your surgeon.  2. The extremity that is blocked will need to be protected until the numbness is gone and the  Strength has returned. Because you cannot feel it, you will need to take extra care to avoid injury. Because it may be weak, you may have difficulty moving it or using it. You may not know what position it is in without looking at it while the block is in effect.  3. For blocks in the legs and feet, returning to weight bearing and walking needs to be done carefully. You will need to wait until the numbness is entirely gone and the strength has returned. You should be able to move your leg and foot normally before you try and bear weight or walk. You will need someone to be with you when you first try to ensure you do not fall and possibly risk injury.  4. Bruising and tenderness at the needle site are common side effects and will resolve in a few days.  5. Persistent numbness or new problems with movement should be communicated to the surgeon or the [facility] [phone] [facility] [phone]).  No Tylenol until after 1:40pm today if needed

## 2023-05-02 NOTE — Interval H&P Note (Signed)
History and Physical Interval Note:  05/02/2023 9:04 AM  Chris Fitzgerald  has presented today for surgery, with the diagnosis of Closed fracture of fifth metacarpal bone of left hand.  The various methods of treatment have been discussed with the patient and family. After consideration of risks, benefits and other options for treatment, the patient has consented to  Procedure(s) with comments: OPEN REDUCTION INTERNAL FIXATION (ORIF) METACARPAL left 5th (Left) - regional and MAC 60 min CLOSED REDUCTION METACARPAL (FINGER) 4th (Left) - regional and MAC 60 min as a surgical intervention.  The patient's history has been reviewed, patient examined, no change in status, stable for surgery.  I have reviewed the patient's chart and labs.  Questions were answered to the patient's satisfaction.     [name]

## 2023-05-02 NOTE — Transfer of Care (Signed)
Immediate Anesthesia Transfer of Care Note  Patient: KANIN RICKETTS  Procedure(s) Performed: OPEN REDUCTION INTERNAL FIXATION (ORIF) METACARPAL left 5th (Left: Finger) CLOSED REDUCTION METACARPAL (FINGER) 4th (Left: Finger)  Patient Location: PACU  Anesthesia Type:MAC combined with regional for post-op pain  Level of Consciousness: drowsy and patient cooperative  Airway & Oxygen Therapy: Patient Spontanous Breathing and Patient connected to face mask oxygen  Post-op Assessment: Report given to RN and Post -op Vital signs reviewed and stable  Post vital signs: Reviewed and stable  Last Vitals:  Vitals Value Taken Time  BP    Temp    Pulse 54 05/02/23 0943  Resp    SpO2 99 % 05/02/23 0943  Vitals shown include unvalidated device data.  Last Pain:  Vitals:   05/02/23 0731  TempSrc: Oral  PainSc: 0-No pain      Patients Stated Pain Goal: 5 (05/02/23 0731)  Complications: No notable events documented.

## 2023-05-02 NOTE — Anesthesia Preprocedure Evaluation (Addendum)
Anesthesia Evaluation  Patient identified by MRN, date of birth, ID band Patient awake    Reviewed: Allergy & Precautions, NPO status , Patient's Chart, lab work & pertinent test results  Airway Mallampati: I  TM Distance: >3 FB Neck ROM: Full    Dental no notable dental hx. (+) Teeth Intact, Dental Advisory Given   Pulmonary asthma , Current Smoker and Patient abstained from smoking.   Pulmonary exam normal breath sounds clear to auscultation       Cardiovascular negative cardio ROS Normal cardiovascular exam Rhythm:Regular Rate:Normal     Neuro/Psych negative neurological ROS  negative psych ROS   GI/Hepatic negative GI ROS,,,(+)     substance abuse  marijuana use  Endo/Other  negative endocrine ROS    Renal/GU negative Renal ROS  negative genitourinary   Musculoskeletal negative musculoskeletal ROS (+)    Abdominal   Peds  (+) ADHD Hematology negative hematology ROS (+)   Anesthesia Other Findings   Reproductive/Obstetrics                             Anesthesia Physical Anesthesia Plan  ASA: 2  Anesthesia Plan: MAC and Regional   Post-op Pain Management: Regional block* and Tylenol PO (pre-op)*   Induction: Intravenous  PONV Risk Score and Plan: Propofol infusion, Treatment may vary due to age or medical condition, Midazolam, Ondansetron and Dexamethasone  Airway Management Planned: Natural Airway  Additional Equipment:   Intra-op Plan:   Post-operative Plan:   Informed Consent: I have reviewed the patients History and Physical, chart, labs and discussed the procedure including the risks, benefits and alternatives for the proposed anesthesia with the patient or authorized representative who has indicated his/her understanding and acceptance.     Dental advisory given  Plan Discussed with: CRNA  Anesthesia Plan Comments:        Anesthesia Quick Evaluation

## 2023-05-02 NOTE — Anesthesia Procedure Notes (Signed)
Anesthesia Regional Block: Supraclavicular block   Pre-Anesthetic Checklist: , timeout performed,  Correct Patient, Correct Site, Correct Laterality,  Correct Procedure, Correct Position, site marked,  Risks and benefits discussed,  Pre-op evaluation,  At surgeon's request and post-op pain management  Laterality: Left  Prep: Maximum Sterile Barrier Precautions used, chloraprep       Needles:  Injection technique: Single-shot  Needle Type: Echogenic Stimulator Needle     Needle Length: 5cm  Needle Gauge: 21     Additional Needles:   Procedures:,,,, ultrasound used (permanent image in chart),,    Narrative:  Start time: 05/02/2023 8:20 AM End time: 05/02/2023 8:24 AM Injection made incrementally with aspirations every 5 mL. Anesthesiologist: Elmer Picker, MD

## 2023-05-02 NOTE — Progress Notes (Signed)
Assisted Dr. Woodrum with left, supraclavicular, ultrasound guided block. Side rails up, monitors on throughout procedure. See vital signs in flow sheet. Tolerated Procedure well. 

## 2023-05-03 ENCOUNTER — Encounter (HOSPITAL_BASED_OUTPATIENT_CLINIC_OR_DEPARTMENT_OTHER): Payer: Self-pay | Admitting: Orthopedic Surgery

## 2023-05-03 NOTE — Anesthesia Postprocedure Evaluation (Signed)
Anesthesia Post Note  Patient: Chris Fitzgerald  Procedure(s) Performed: OPEN REDUCTION INTERNAL FIXATION (ORIF) METACARPAL left 5th (Left: Finger) CLOSED REDUCTION METACARPAL (FINGER) 4th (Left: Finger)     Patient location during evaluation: PACU Anesthesia Type: Regional and MAC Level of consciousness: awake and alert Pain management: pain level controlled Vital Signs Assessment: post-procedure vital signs reviewed and stable Respiratory status: spontaneous breathing, nonlabored ventilation, respiratory function stable and patient connected to nasal cannula oxygen Cardiovascular status: stable and blood pressure returned to baseline Postop Assessment: no apparent nausea or vomiting Anesthetic complications: no  No notable events documented.  Last Vitals:  Vitals:   05/02/23 1030 05/02/23 1105  BP: 104/73 111/70  Pulse: (!) 52 (!) 50  Resp: 15 16  Temp: 36.5 C 36.5 C  SpO2:  100%    Last Pain:  Vitals:   05/03/23 1003  TempSrc:   PainSc: 0-No pain                 Audra Kagel L Josy Peaden

## 2023-05-10 ENCOUNTER — Other Ambulatory Visit (HOSPITAL_BASED_OUTPATIENT_CLINIC_OR_DEPARTMENT_OTHER): Payer: Self-pay

## 2023-10-04 ENCOUNTER — Encounter (HOSPITAL_COMMUNITY): Payer: Self-pay | Admitting: *Deleted

## 2023-10-04 ENCOUNTER — Emergency Department (HOSPITAL_COMMUNITY)
Admission: EM | Admit: 2023-10-04 | Discharge: 2023-10-05 | Disposition: A | Discharge status: Home / Self Care | Payer: Self-pay | Attending: Emergency Medicine | Admitting: Emergency Medicine

## 2023-10-04 ENCOUNTER — Other Ambulatory Visit: Payer: Self-pay

## 2023-10-04 DIAGNOSIS — G44039 Episodic paroxysmal hemicrania, not intractable: Secondary | ICD-10-CM | POA: Insufficient documentation

## 2023-10-04 DIAGNOSIS — J45909 Unspecified asthma, uncomplicated: Secondary | ICD-10-CM | POA: Insufficient documentation

## 2023-10-04 NOTE — ED Triage Notes (Signed)
Pt states for 3 days he has had a constant pressure behind left eye with with numbness in rt hand and arm. At present time arm and hand feels normal,.

## 2023-10-05 LAB — CBG MONITORING, ED: Glucose-Capillary: 106 mg/dL — ABNORMAL HIGH (ref 70–99)

## 2023-10-05 MED ORDER — KETOROLAC TROMETHAMINE 60 MG/2ML IM SOLN
30.0000 mg | Freq: Once | INTRAMUSCULAR | Status: AC
  Administered 2023-10-05: 30 mg via INTRAMUSCULAR
  Filled 2023-10-05: qty 2

## 2023-10-05 NOTE — ED Provider Notes (Signed)
[place] EMERGENCY DEPARTMENT AT [facility] Provider Note  CSN: [id number] Arrival date & time: 10/04/23 1828  Chief Complaint(s) No chief complaint on file.  HPI Chris Fitzgerald is a 32 y.o. male here for several days of intermittent left-sided headache with pressure behind the left eye.  The headache is similar to prior headaches the patient has had in the past however they usually tend to resolve within a day and not return frequently.  Patient reports taking over-the-counter medicine which seems to relieve the pain.  Today at work he reports he heard a loud squeaking sound from machinery which exacerbated his headache.  Pain is now subsided and currently mild.  He denies any recent fevers or infections.  No coughing or congestion.  Reported bout of emesis several days ago but none in the last 48 hours.  Additionally patient reports intermittent left arm numbness and tingling exacerbated with certain movements with the right upper extremity.  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  He does notice that certain movements tend to exacerbate this.  He denies any fall or trauma.  Numbness and tingling have subsided this evening.  Patient also reported that he has a family history of diabetes and wanted to make sure he was not a diabetic.  The history is provided by the patient.    Past Medical History Past Medical History:  Diagnosis Date   ADHD (attention deficit hyperactivity disorder)    Asthma    There are no problems to display for this patient.  Home Medication(s) Prior to Admission medications   Medication Sig Start Date End Date Taking? Authorizing Provider  ibuprofen (ADVIL) 800 MG tablet Take 1 tablet (800 mg total) by mouth every 8 (eight) hours as needed for moderate pain. 05/02/23   Julien Nordmann, PA-C  oxyCODONE-acetaminophen (PERCOCET/ROXICET) 5-325 MG tablet Take 1 tablet by mouth every 6 (six) hours  as needed for severe pain. Patient not taking: Reported on 04/26/2023 04/19/23   Long, Arlyss Repress, MD  senna-docusate (SENOKOT-S) 8.6-50 MG tablet Take 1 tablet by mouth at bedtime as needed for mild constipation. 04/19/23   Long, Arlyss Repress, MD                                                                                                                                    Allergies Patient has no known allergies.  Review of Systems Review of Systems As noted in HPI  Physical Exam Vital Signs  I have reviewed the triage vital signs BP 122/85   Pulse 82   Temp 98.6 F (37 C)   Resp 18   Ht 6' (1.829 m)   Wt 80.7 kg   SpO2 99%   BMI 24.14 kg/m   Physical Exam Vitals reviewed.  Constitutional:      General: He is not in acute distress.    Appearance:  He is well-developed. He is not diaphoretic.  HENT:     Head: Normocephalic and atraumatic.     Nose: Nose normal.  Eyes:     General: No scleral icterus.       Right eye: No discharge.        Left eye: No discharge.     Conjunctiva/sclera: Conjunctivae normal.     Pupils: Pupils are equal, round, and reactive to light.  Cardiovascular:     Rate and Rhythm: Normal rate and regular rhythm.     Heart sounds: No murmur heard.    No friction rub. No gallop.  Pulmonary:     Effort: Pulmonary effort is normal. No respiratory distress.     Breath sounds: Normal breath sounds. No stridor. No rales.  Abdominal:     General: There is no distension.     Palpations: Abdomen is soft.     Tenderness: There is no abdominal tenderness.  Musculoskeletal:        General: No tenderness.     Right hand: No tenderness. Normal range of motion. Normal strength. Normal sensation. Normal pulse.     Left hand: No tenderness. Normal range of motion. Normal strength. Normal sensation. Normal pulse.     Cervical back: Normal range of motion and neck supple.  Skin:    General: Skin is warm and dry.     Findings: No erythema or rash.  Neurological:      Mental Status: He is alert and oriented to person, place, and time.     ED Results and Treatments Labs (all labs ordered are listed, but only abnormal results are displayed) Labs Reviewed  CBG MONITORING, ED - Abnormal; Notable for the following components:      Result Value   Glucose-Capillary 106 (*)    All other components within normal limits                                                                                                                         EKG  EKG Interpretation Date/Time:    Ventricular Rate:    PR Interval:    QRS Duration:    QT Interval:    QTC Calculation:   R Axis:      Text Interpretation:         Radiology No results found.  Medications Ordered in ED Medications  ketorolac (TORADOL) injection 30 mg (30 mg Intramuscular Given 10/05/23 0050)   Procedures Procedures  (including critical care time) Medical Decision Making / ED Course   Medical Decision Making Risk Prescription drug management.    Typical headache for the patient. Non focal neuro exam.  No fever. Doubt meningitis.  Doubt IIH. No recent head trauma. Doubt intracranial bleed.  No indication for imaging.   IM toradol for pain CBG WNL  RUE paresthesias likely cubital/carpal tunnel or overuse syndrome.       Final Clinical Impression(s) / ED Diagnoses Final diagnoses:  Episodic paroxysmal hemicrania, not intractable  The patient appears reasonably screened and/or stabilized for discharge and I doubt any other medical condition or other [facility] requiring further screening, evaluation, or treatment in the ED at this time. I have discussed the findings, Dx and Tx plan with the patient/family who expressed understanding and agree(s) with the plan. Discharge instructions discussed at length. The patient/family was given strict return precautions who verbalized understanding of the instructions. No further questions at time of discharge.  Disposition:  Discharge  Condition: Good  ED Discharge Orders     None        Follow Up: Primary care provider  Call  to schedule an appointment for close follow up    This chart was dictated using voice recognition software.  Despite best efforts to proofread,  errors can occur which can change the documentation meaning.    Nira Conn, MD 10/06/23 [id number]

## 2023-11-13 ENCOUNTER — Encounter (HOSPITAL_BASED_OUTPATIENT_CLINIC_OR_DEPARTMENT_OTHER): Payer: Self-pay | Admitting: Emergency Medicine

## 2023-11-13 ENCOUNTER — Emergency Department (HOSPITAL_BASED_OUTPATIENT_CLINIC_OR_DEPARTMENT_OTHER): Payer: No Typology Code available for payment source

## 2023-11-13 ENCOUNTER — Emergency Department (HOSPITAL_BASED_OUTPATIENT_CLINIC_OR_DEPARTMENT_OTHER)
Admission: EM | Admit: 2023-11-13 | Discharge: 2023-11-13 | Disposition: A | Discharge status: Home / Self Care | Payer: No Typology Code available for payment source | Attending: Emergency Medicine | Admitting: Emergency Medicine

## 2023-11-13 ENCOUNTER — Emergency Department (HOSPITAL_BASED_OUTPATIENT_CLINIC_OR_DEPARTMENT_OTHER): Payer: No Typology Code available for payment source | Admitting: Radiology

## 2023-11-13 DIAGNOSIS — M542 Cervicalgia: Secondary | ICD-10-CM | POA: Diagnosis not present

## 2023-11-13 DIAGNOSIS — M25531 Pain in right wrist: Secondary | ICD-10-CM | POA: Diagnosis present

## 2023-11-13 DIAGNOSIS — Y92410 Unspecified street and highway as the place of occurrence of the external cause: Secondary | ICD-10-CM | POA: Insufficient documentation

## 2023-11-13 DIAGNOSIS — Z87891 Personal history of nicotine dependence: Secondary | ICD-10-CM | POA: Diagnosis not present

## 2023-11-13 MED ORDER — IBUPROFEN 400 MG PO TABS
600.0000 mg | ORAL_TABLET | Freq: Once | ORAL | Status: AC
  Administered 2023-11-13: 600 mg via ORAL
  Filled 2023-11-13: qty 1
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
## 2023-11-13 NOTE — Discharge Instructions (Signed)
As discussed, x-ray concerning for fracture of one of the bones of your hand.  Wear splint until follow-up with hand specialist in the outpatient setting.  See information attached to discharge papers to call to schedule an appointment.  Will recommend taking ibuprofen for pain as well as icing the area and elevating above level of your heart to help with pain.  Please do not hesitate to return to the emergency department if there are worrisome signs and symptoms we discussed to become apparent.
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
## 2024-09-06 ENCOUNTER — Other Ambulatory Visit: Payer: Self-pay

## 2024-09-06 ENCOUNTER — Emergency Department (HOSPITAL_BASED_OUTPATIENT_CLINIC_OR_DEPARTMENT_OTHER): Payer: MEDICAID

## 2024-09-06 ENCOUNTER — Encounter (HOSPITAL_BASED_OUTPATIENT_CLINIC_OR_DEPARTMENT_OTHER): Payer: Self-pay

## 2024-09-06 ENCOUNTER — Emergency Department (HOSPITAL_BASED_OUTPATIENT_CLINIC_OR_DEPARTMENT_OTHER)
Admission: EM | Admit: 2024-09-06 | Discharge: 2024-09-06 | Discharge status: Against Medical Advice | Payer: MEDICAID | Attending: Emergency Medicine | Admitting: Emergency Medicine

## 2024-09-06 DIAGNOSIS — W228XXA Striking against or struck by other objects, initial encounter: Secondary | ICD-10-CM | POA: Insufficient documentation

## 2024-09-06 DIAGNOSIS — R202 Paresthesia of skin: Secondary | ICD-10-CM | POA: Insufficient documentation

## 2024-09-06 DIAGNOSIS — M25531 Pain in right wrist: Secondary | ICD-10-CM | POA: Insufficient documentation

## 2024-09-06 DIAGNOSIS — Z5321 Procedure and treatment not carried out due to patient leaving prior to being seen by health care provider: Secondary | ICD-10-CM | POA: Insufficient documentation
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
